# Patient Record
Sex: Male | Born: 1990 | Race: White | Hispanic: No | Marital: Married | Smoking: Former smoker
Health system: Southern US, Community
[De-identification: ages and names within clinical notes are randomized; demographics above are authoritative.]

## PROBLEM LIST (undated history)

## (undated) DIAGNOSIS — K219 Gastro-esophageal reflux disease without esophagitis: Secondary | ICD-10-CM

## (undated) DIAGNOSIS — J309 Allergic rhinitis, unspecified: Secondary | ICD-10-CM

## (undated) DIAGNOSIS — Z87891 Personal history of nicotine dependence: Secondary | ICD-10-CM

## (undated) DIAGNOSIS — J45909 Unspecified asthma, uncomplicated: Secondary | ICD-10-CM

## (undated) DIAGNOSIS — F909 Attention-deficit hyperactivity disorder, unspecified type: Secondary | ICD-10-CM

## (undated) HISTORY — DX: Unspecified asthma, uncomplicated: J45.909

## (undated) HISTORY — DX: Attention-deficit hyperactivity disorder, unspecified type: F90.9

## (undated) HISTORY — DX: Gastro-esophageal reflux disease without esophagitis: K21.9

## (undated) HISTORY — DX: Personal history of nicotine dependence: Z87.891

## (undated) HISTORY — DX: Allergic rhinitis, unspecified: J30.9

---
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
## 2010-09-16 ENCOUNTER — Emergency Department (HOSPITAL_COMMUNITY)
Admission: EM | Admit: 2010-09-16 | Discharge: 2010-09-16 | Disposition: A | Discharge status: Home / Self Care | Payer: Self-pay | Attending: Emergency Medicine | Admitting: Emergency Medicine

## 2010-09-16 ENCOUNTER — Emergency Department (HOSPITAL_COMMUNITY): Payer: Self-pay

## 2010-09-16 DIAGNOSIS — J45909 Unspecified asthma, uncomplicated: Secondary | ICD-10-CM | POA: Insufficient documentation

## 2010-09-16 DIAGNOSIS — S0100XA Unspecified open wound of scalp, initial encounter: Secondary | ICD-10-CM | POA: Insufficient documentation

## 2010-09-16 DIAGNOSIS — Y9229 Other specified public building as the place of occurrence of the external cause: Secondary | ICD-10-CM | POA: Insufficient documentation

## 2010-09-16 DIAGNOSIS — R6884 Jaw pain: Secondary | ICD-10-CM | POA: Insufficient documentation

## 2010-09-16 DIAGNOSIS — S01501A Unspecified open wound of lip, initial encounter: Secondary | ICD-10-CM | POA: Insufficient documentation

## 2010-09-16 DIAGNOSIS — F411 Generalized anxiety disorder: Secondary | ICD-10-CM | POA: Insufficient documentation

## 2011-05-03 ENCOUNTER — Ambulatory Visit (INDEPENDENT_AMBULATORY_CARE_PROVIDER_SITE_OTHER): Payer: 59 | Admitting: Family Medicine

## 2011-05-03 VITALS — BP 136/75 | HR 80 | Temp 98.2°F | Resp 16 | Ht 72.0 in | Wt 207.6 lb

## 2011-05-03 DIAGNOSIS — F909 Attention-deficit hyperactivity disorder, unspecified type: Secondary | ICD-10-CM

## 2011-05-03 DIAGNOSIS — F988 Other specified behavioral and emotional disorders with onset usually occurring in childhood and adolescence: Secondary | ICD-10-CM

## 2011-05-03 DIAGNOSIS — J45902 Unspecified asthma with status asthmaticus: Secondary | ICD-10-CM

## 2011-05-03 MED ORDER — MOMETASONE FURO-FORMOTEROL FUM 100-5 MCG/ACT IN AERO: 2.0000 | INHALATION_SPRAY | Freq: Two times a day (BID) | RESPIRATORY_TRACT | Status: DC

## 2011-05-03 MED ORDER — AMPHETAMINE-DEXTROAMPHET ER 30 MG PO CP24: 30.0000 mg | ORAL_CAPSULE | ORAL | Status: DC

## 2011-05-03 MED ORDER — ALBUTEROL SULFATE HFA 108 (90 BASE) MCG/ACT IN AERS: 2.0000 | INHALATION_SPRAY | Freq: Four times a day (QID) | RESPIRATORY_TRACT | Status: DC | PRN

## 2011-05-03 NOTE — Progress Notes (Signed)
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  Needs refill on medications.  Was tested as child both here and [employer] where he attended school.    Has given permission to his PCP in [place], [place]  On Adderall since elementary school  Also needs albuterol refilled  F/H:  Grandfather had triple bypass, parents are alive and well Cousin is on adderall.  Older brother never took the medicine  O:  Alert, NAD HEENT:  Grossly normal Chest:  Clear Heart:  Reg, no murmur or gallop Abdomen:  Soft, no HSM, no masses or tenderness Skin:  No rashes.  I called Dr. Barbra Sarks who has been prescribing Adderall XR 30 daily, last filled March 11th.  She has verified his ADD status  A:  Ok to refill adderall and asthma meds  P:  Refills approved

## 2011-08-23 ENCOUNTER — Encounter: Payer: Self-pay | Admitting: Family Medicine

## 2012-01-01 ENCOUNTER — Telehealth: Payer: Self-pay | Admitting: Family Medicine

## 2012-01-01 ENCOUNTER — Encounter: Payer: Self-pay | Admitting: Family Medicine

## 2012-01-01 ENCOUNTER — Ambulatory Visit (INDEPENDENT_AMBULATORY_CARE_PROVIDER_SITE_OTHER): Payer: BC Managed Care – PPO | Admitting: Family Medicine

## 2012-01-01 VITALS — BP 131/85 | HR 60 | Temp 98.5°F | Ht 72.0 in | Wt 217.0 lb

## 2012-01-01 DIAGNOSIS — J45909 Unspecified asthma, uncomplicated: Secondary | ICD-10-CM

## 2012-01-01 DIAGNOSIS — K219 Gastro-esophageal reflux disease without esophagitis: Secondary | ICD-10-CM

## 2012-01-01 DIAGNOSIS — J45998 Other asthma: Secondary | ICD-10-CM

## 2012-01-01 DIAGNOSIS — F909 Attention-deficit hyperactivity disorder, unspecified type: Secondary | ICD-10-CM

## 2012-01-01 DIAGNOSIS — F17200 Nicotine dependence, unspecified, uncomplicated: Secondary | ICD-10-CM

## 2012-01-01 MED ORDER — VARENICLINE TARTRATE 1 MG PO TABS: 1.0000 mg | ORAL_TABLET | Freq: Two times a day (BID) | ORAL | Status: DC

## 2012-01-01 MED ORDER — AMPHETAMINE-DEXTROAMPHETAMINE 15 MG PO TABS: 15.0000 mg | ORAL_TABLET | Freq: Every day | ORAL | Status: DC

## 2012-01-01 MED ORDER — AMPHETAMINE-DEXTROAMPHET ER 30 MG PO CP24: 30.0000 mg | ORAL_CAPSULE | ORAL | Status: DC

## 2012-01-01 MED ORDER — BUDESONIDE-FORMOTEROL FUMARATE 160-4.5 MCG/ACT IN AERO: 2.0000 | INHALATION_SPRAY | Freq: Two times a day (BID) | RESPIRATORY_TRACT | Status: DC

## 2012-01-01 MED ORDER — MOMETASONE FURO-FORMOTEROL FUM 100-5 MCG/ACT IN AERO: 2.0000 | INHALATION_SPRAY | Freq: Two times a day (BID) | RESPIRATORY_TRACT | Status: DC

## 2012-01-01 MED ORDER — ESOMEPRAZOLE MAGNESIUM 40 MG PO CPDR: 40.0000 mg | DELAYED_RELEASE_CAPSULE | Freq: Every day | ORAL | Status: DC

## 2012-01-01 NOTE — Progress Notes (Signed)
Office Note 01/05/2012  CC:  Chief Complaint  Patient presents with  . Establish Care    Rash; ADD; GERD    HPI:  Mark Gardner is a 21 y.o. White male who is here to establish care. Patient's most recent primary MD: [facility]. Old records in EPIC/HL were reviewed prior to or during today's visit.  ADHD: has carried this dx since grade school, has been out of meds for several months.  Finds adderall XR 30mg  qAM about75% helpful and it usually lasts 7 hrs or so--admittedly not long enough on most days.  We discussed some options and he voiced desire for an additional dose of short acting adderall for most early afternoons.  Minimal app supp s/e but no wt loss, no other probs on this med.  Asthma: Says he has been requiring albuterol for rescue "too much".  Says he has been compliant with dulera 100/50 and was once on singulair but never got it RFd after running out b/c he doesn't like to take pills (too hard to remember).    He is currently in the process of quitting smoking: has finished the chantix trial week.  Denies SE from med.   Past Medical History  Diagnosis Date  . Asthma   . ADHD (attention deficit hyperactivity disorder)     Dx'd in gradeschool  . Tobacco dependence   . Allergic rhinitis     dogs and cats (he has a dog and a cat).  . GERD (gastroesophageal reflux disease)     Past Surgical History  Procedure Date  . Tonsillectomy and adenoidectomy 2008    Family History  Problem Relation Age of Onset  . Hyperlipidemia Mother   . Hyperlipidemia Father   . Heart disease Father     History   Social History  . Marital Status: Single    Spouse Name: N/A    Number of Children: N/A  . Years of Education: N/A   Occupational History  . Not on file.   Social History Main Topics  . Smoking status: Current Every Day Smoker    Types: Cigarettes  . Smokeless tobacco: Never Used     Comment: currently on Chantix 12/2011  . Alcohol Use: Yes   . Drug Use: No  . Sexually Active: Not on file   Other Topics Concern  . 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  Works out several days a week.Two brothers, one sister--all older.  Parents live in [place].    Outpatient Encounter Prescriptions as of 01/01/2012  Medication Sig Dispense Refill  . albuterol (PROVENTIL HFA;VENTOLIN HFA) 108 (90 BASE) MCG/ACT inhaler Inhale 2 puffs into the lungs every 6 (six) hours as needed.  1 Inhaler  11  . amphetamine-dextroamphetamine (ADDERALL XR) 30 MG 24 hr capsule Take 1 capsule (30 mg total) by mouth every morning.  30 capsule  0  . [DISCONTINUED] amphetamine-dextroamphetamine (ADDERALL XR) 30 MG 24 hr capsule Take 1 capsule (30 mg total) by mouth every morning.  30 capsule  0  . [DISCONTINUED] mometasone-formoterol (DULERA) 100-5 MCG/ACT AERO Inhale 2 puffs into the lungs 2 (two) times daily.  1 Inhaler  11  . [DISCONTINUED] mometasone-formoterol (DULERA) 100-5 MCG/ACT AERO Inhale 2 puffs into the lungs 2 (two) times daily.  1 Inhaler  11  . amphetamine-dextroamphetamine (ADDERALL) 15 MG tablet Take 1 tablet (15 mg  total) by mouth daily.  30 tablet  0  . esomeprazole (NEXIUM) 40 MG capsule Take 1 capsule (40 mg total) by mouth daily before breakfast.  90 capsule  2  . varenicline (CHANTIX CONTINUING MONTH PAK) 1 MG tablet Take 1 tablet (1 mg total) by mouth 2 (two) times daily.  60 tablet  1  . [DISCONTINUED] budesonide-formoterol (SYMBICORT) 160-4.5 MCG/ACT inhaler Inhale 2 puffs into the lungs 2 (two) times daily.  1 Inhaler  6  . [DISCONTINUED] budesonide-formoterol (SYMBICORT) 160-4.5 MCG/ACT inhaler Inhale 2 puffs into the lungs 2 (two) times daily.  1 Inhaler  6  . [DISCONTINUED] esomeprazole (NEXIUM) 40 MG capsule Take 1 capsule (40 mg total) by mouth daily before breakfast.  90  capsule  2  . [DISCONTINUED] varenicline (CHANTIX CONTINUING MONTH PAK) 1 MG tablet Take 1 tablet (1 mg total) by mouth 2 (two) times daily.  60 tablet  1    Allergies  Allergen Reactions  . Sulfa Antibiotics     ROS Review of Systems  Constitutional: Negative for fever and fatigue.  HENT: Negative for congestion and sore throat.   Eyes: Negative for visual disturbance.  Respiratory: Negative for cough.   Cardiovascular: Negative for chest pain.  Gastrointestinal: Negative for nausea and abdominal pain.  Genitourinary: Negative for dysuria.  Musculoskeletal: Negative for back pain and joint swelling.  Skin: Positive for rash (scattered eczematous patches on arms and trunk).  Neurological: Negative for weakness and headaches.  Hematological: Negative for adenopathy.  Psychiatric/Behavioral: Negative for dysphoric mood. The patient is not nervous/anxious.     PE; Blood pressure 131/85, pulse 60, temperature 98.5 F (36.9 C), temperature source Temporal, height 6' (1.829 m), weight 217 lb (98.431 kg), SpO2 98.00%. Gen: Alert, well appearing.  Patient is oriented to person, place, time, and situation. ENT: Ears: EACs clear, normal epithelium.  TMs with good light reflex and landmarks bilaterally.  Eyes: no injection, icteris, swelling, or exudate.  EOMI, PERRLA. Nose: no drainage or turbinate edema/swelling.  No injection or focal lesion.  Mouth: lips without lesion/swelling.  Oral mucosa pink and moist.  Dentition intact and without obvious caries or gingival swelling.  Oropharynx without erythema, exudate, or swelling.  Neck - No masses or thyromegaly or limitation in range of motion CV: RRR, no m/r/g.   LUNGS: CTA bilat, nonlabored resps, good aeration in all lung fields. ABD: soft, NT, ND, BS normal.  No hepatospenomegaly or mass.  No bruits. EXT: no clubbing, cyanosis, or edema.  SKIN: scattered small patches of pinkish papular rash with excoriations c/w eczematous  dermatitis  Pertinent labs:  None today  ASSESSMENT AND PLAN:   New pt: obtain old records from [employer].  ADHD (attention deficit hyperactivity disorder) Trial of adderall short acting 15mg , 1 q afternoon prn. Continue adderall xR 30mg  qAM.  Therapeutic expectations and side effect profile of medication discussed today.  Patient's questions answered. Discussed trial of vyvanse in place of this entire regiment but pt cited that there will likely be days in which he does not need an extended duration of action such as vyvanse would consistently provide. 30 day supply of both adderalls given today, no RF.   Recheck in office in 1 mo.  Asthma, persistent not controlled Increase Dulera to 200/50, 2 puffs bid.  Continue q4-6h prn albuterol HFA for rescue.   May eventually need to add singulair back on even though he doesn't like taking pills. No sign of any need for systemic steroid burst at this  time.  Tobacco dependence Currently in middle of cessation attempt.  He is finished with the start-up phase of chantix and I gave rx to finish a 12 wk trial of the full dose of chantix.  Discouraged use of nicotine replacement with chantix.  GERD (gastroesophageal reflux disease) Nexium rx'd today.  An After Visit Summary was printed and given to the patient.  35 min was spent with pt today, with >50% of this time spent in counseling and care coordination for the above problems.  Return in about 4 weeks (around 01/29/2012) for f/u aDHD.

## 2012-01-01 NOTE — Telephone Encounter (Signed)
Patient needs his Rx's to be sent to [facility]. Patient said Dr. Milinda Cave said he was going to change the [facility] to 200-5, please change. Patient wants to know if the adderrall can be sent to [facility]. Please call him.

## 2012-01-02 NOTE — Telephone Encounter (Signed)
I messed up on the dulera, so I just now did a new one and I need you to figure out how to [facility]--I'll mess it up. Otherwise, I printed out all of his rx's (including adderall) yesterday and handed them to him AT HIS REQUEST so he could [facility] them to [facility]. He needs to send in the ones I gave him!--thx

## 2012-01-02 NOTE — Telephone Encounter (Signed)
I have attempted to contact this patient by phone with the following results: left message to return my call on answering machine.

## 2012-01-02 NOTE — Telephone Encounter (Signed)
Dr. Milinda Cave, please advise about Dulera dose.  OV note not complete.  I will him know about dulera and adderall when we call him back.

## 2012-01-05 ENCOUNTER — Encounter: Payer: Self-pay | Admitting: Family Medicine

## 2012-01-05 DIAGNOSIS — J4540 Moderate persistent asthma, uncomplicated: Secondary | ICD-10-CM | POA: Insufficient documentation

## 2012-01-05 DIAGNOSIS — F17200 Nicotine dependence, unspecified, uncomplicated: Secondary | ICD-10-CM | POA: Insufficient documentation

## 2012-01-05 DIAGNOSIS — F909 Attention-deficit hyperactivity disorder, unspecified type: Secondary | ICD-10-CM | POA: Insufficient documentation

## 2012-01-05 DIAGNOSIS — K219 Gastro-esophageal reflux disease without esophagitis: Secondary | ICD-10-CM | POA: Insufficient documentation

## 2012-01-05 NOTE — Assessment & Plan Note (Signed)
Increase Dulera to 200/50, 2 puffs bid.  Continue q4-6h prn albuterol HFA for rescue.   May eventually need to add singulair back on even though he doesn't like taking pills. No sign of any need for systemic steroid burst at this time.

## 2012-01-05 NOTE — Assessment & Plan Note (Signed)
Trial of adderall short acting 15mg , 1 q afternoon prn. Continue adderall xR 30mg  qAM.  Therapeutic expectations and side effect profile of medication discussed today.  Patient's questions answered. Discussed trial of vyvanse in place of this entire regiment but pt cited that there will likely be days in which he does not need an extended duration of action such as vyvanse would consistently provide. 30 day supply of both adderalls given today, no RF.   Recheck in office in 1 mo.

## 2012-01-05 NOTE — Assessment & Plan Note (Signed)
Nexium rx'd today.

## 2012-01-05 NOTE — Assessment & Plan Note (Signed)
Currently in middle of cessation attempt.  He is finished with the start-up phase of chantix and I gave rx to finish a 12 wk trial of the full dose of chantix.  Discouraged use of nicotine replacement with chantix.

## 2012-01-08 MED ORDER — MOMETASONE FURO-FORMOTEROL FUM 200-5 MCG/ACT IN AERO: INHALATION_SPRAY | RESPIRATORY_TRACT | Status: DC

## 2012-01-08 NOTE — Telephone Encounter (Signed)
Pt notified he can mail adderall, but he would have to mail original, it could not be faxed.  Advised we would send [facility].  Pt requests chantix to be sent.  Advised he was given written RX for that and he would need to mail to pharmacy.  He is agreeable.  He will take adderall to local pharmacy.

## 2012-01-31 ENCOUNTER — Ambulatory Visit: Payer: BC Managed Care – PPO | Admitting: Family Medicine

## 2012-02-27 ENCOUNTER — Telehealth: Payer: Self-pay | Admitting: Family Medicine

## 2012-02-27 MED ORDER — AMPHETAMINE-DEXTROAMPHET ER 30 MG PO CP24: 30.0000 mg | ORAL_CAPSULE | ORAL | Status: DC

## 2012-02-27 MED ORDER — AMPHETAMINE-DEXTROAMPHETAMINE 15 MG PO TABS: 15.0000 mg | ORAL_TABLET | Freq: Every day | ORAL | Status: DC

## 2012-02-27 NOTE — Telephone Encounter (Signed)
I spoke to the patient and he is in town.  He does not remember receiving written 90 day scripts for nexium and asthma meds in December.  Pt has called [facility] and they do not have Dulera RX that was called in on 12/13 and pt states they told him we can refill Adderall with them. Pt did not have adderall scripts filled and does not have them now.  He did not keep January appt due to not taking meds. Advised pt he needs follow up.  Advised we do not give 90 day scripts for Adderall and [facility] has to have hard copy of RX as it is a controlled substance.  Advised pt we do not replace lost scripts...once it leaves our office it is the patient's responsibility.  Advised he must be established, on the same dose and keep scheduled follow ups before we will prescribe more than a 30 day supply. No meds have been called to [facility] by me.  Please advise.  Pt aware he will receive call back tomorrow.

## 2012-02-27 NOTE — Telephone Encounter (Signed)
Verified with Diane that pt needs 90 day RX's of all meds, including Adderall. Pt last seen on 01/01/12 and was advised to have 1 month follow up after trial of new dose.  Pt has not been seen since 12/2011. I will send refills to mail order.  Does pt need follow up prior to any Adderall refills?  Pt is student at [employer], but I am not sure if patient is there now.

## 2012-02-27 NOTE — Telephone Encounter (Signed)
Patient is out of all of his meds, please send Rx to [facility] [phone]

## 2012-02-27 NOTE — Telephone Encounter (Signed)
May print all rx's 90 day supply with no RF except adderall.  I'll do 30 day supply of adderall only, must see for f/u after he has taken this for a month.  Please warn pt that lost rx's as an isolated event is understandable, but if it happens again then it is considered a pattern, and my policy is to rx no further controlled substances if lost rx's happens twice. Emphasize to him that once rx's are handed to him they are his responsibility and he should either bring them to the pharmacy to fill or hold or he should mail to mail-order pharmacy right away.  -thx

## 2012-02-27 NOTE — Telephone Encounter (Signed)
Pls notify pt that I do not do 90 day supply rx's for controlled substances like adderall. Pls see if he is in town or is he at college right now. If in town, would prefer to see him for f/u for ADHD med check and asthma follow up. If he's at college, I am willing to do a 30d supply of his adderall, then he needs to get f/u with his local MD there to get ongoing monitoring and rx's while he's at school. Let me know-thx

## 2012-02-28 NOTE — Telephone Encounter (Signed)
Pt notified of below.  He is agreeable and voices understanding.  Pt is requesting 90 day supply of Chantix also.  Pt started Chantix on 11/18 (verified with pharmacy as we were not prescribing physician).  Pt has been off for about 2 weeks.  Does he need to start over?  Is it OK to send continuing RX to mail order?

## 2012-02-28 NOTE — Telephone Encounter (Signed)
I have attempted to contact this patient by phone with the following results: left message to return my call on answering machine (mobile).  

## 2012-02-28 NOTE — Telephone Encounter (Signed)
I deferred this one to you til Monday after discussing with Judeth Cornfield

## 2012-02-29 NOTE — Telephone Encounter (Signed)
Chantix course is 12 weeks total.  Ask pt how many weeks of this med he has completed and I'll decide if I will rx more.  I am pretty sure he's not going to need/get a 90 day supply, though.  Also, ask if he has successfully quit smoking at this point.-thx

## 2012-03-04 MED ORDER — ESOMEPRAZOLE MAGNESIUM 40 MG PO CPDR: 40.0000 mg | DELAYED_RELEASE_CAPSULE | Freq: Every day | ORAL | Status: DC

## 2012-03-04 MED ORDER — MOMETASONE FURO-FORMOTEROL FUM 200-5 MCG/ACT IN AERO: INHALATION_SPRAY | RESPIRATORY_TRACT | Status: DC

## 2012-03-04 MED ORDER — ALBUTEROL SULFATE HFA 108 (90 BASE) MCG/ACT IN AERS: 2.0000 | INHALATION_SPRAY | Freq: Four times a day (QID) | RESPIRATORY_TRACT | Status: DC | PRN

## 2012-03-04 NOTE — Telephone Encounter (Signed)
90 day RX sent. 

## 2012-03-15 ENCOUNTER — Ambulatory Visit: Payer: 59 | Admitting: Family Medicine

## 2012-03-15 VITALS — BP 126/74 | HR 108 | Temp 98.7°F | Resp 17 | Ht 72.0 in | Wt 214.0 lb

## 2012-03-15 DIAGNOSIS — L270 Generalized skin eruption due to drugs and medicaments taken internally: Secondary | ICD-10-CM

## 2012-03-15 DIAGNOSIS — T7800XA Anaphylactic reaction due to unspecified food, initial encounter: Secondary | ICD-10-CM

## 2012-03-15 DIAGNOSIS — L299 Pruritus, unspecified: Secondary | ICD-10-CM

## 2012-03-15 DIAGNOSIS — L509 Urticaria, unspecified: Secondary | ICD-10-CM

## 2012-03-15 DIAGNOSIS — J029 Acute pharyngitis, unspecified: Secondary | ICD-10-CM

## 2012-03-15 MED ORDER — METHYLPREDNISOLONE SODIUM SUCC 125 MG IJ SOLR
125.0000 mg | Freq: Once | INTRAMUSCULAR | Status: AC
  Administered 2012-03-15: 125 mg via INTRAVENOUS

## 2012-03-15 MED ORDER — DIPHENHYDRAMINE HCL 25 MG PO CAPS
50.0000 mg | ORAL_CAPSULE | Freq: Four times a day (QID) | ORAL | Status: AC | PRN
  Administered 2012-03-15: 50 mg via ORAL

## 2012-03-15 MED ORDER — DIPHENHYDRAMINE HCL 25 MG PO CAPS
50.0000 mg | ORAL_CAPSULE | Freq: Once | ORAL | Status: AC
  Administered 2012-03-15: 50 mg via ORAL

## 2012-03-15 MED ORDER — PREDNISONE 10 MG PO TABS: ORAL_TABLET | ORAL | Status: DC

## 2012-03-15 MED ORDER — EPINEPHRINE 0.3 MG/0.3ML IJ DEVI: 0.3000 mg | Freq: Once | INTRAMUSCULAR | Status: AC

## 2012-03-15 MED ORDER — EPINEPHRINE 0.3 MG/0.3ML IJ DEVI
0.4000 mg | Freq: Once | INTRAMUSCULAR | Status: AC
  Administered 2012-03-15: 0.4 mg via INTRAMUSCULAR

## 2012-03-15 MED ORDER — METHYLPREDNISOLONE ACETATE 80 MG/ML IJ SUSP
120.0000 mg | Freq: Once | INTRAMUSCULAR | Status: AC
  Administered 2012-03-15: 120 mg via INTRAMUSCULAR

## 2012-03-15 NOTE — Patient Instructions (Addendum)
For the next 24h dose Benadryl 50mg  (2 pills) every 4-6 hours and then stop.  If rash/hives return please restart. For the next 5 days use Zyrtec 10mg  daily and Zantac 300mg  daily.  If rash/hives return please restart. Take Prednisone as prescribed.   Due to how your rash started and looked like something you ingested is the most likely cause.  If you develop any tongue or throat swelling or shortness or breath please use Epi pen and then seek medical care.   Hives Hives are itchy, red, swollen areas of the skin. They can vary in size and location on your body. Hives can come and go for hours or several days (acute hives) or for several weeks (chronic hives). Hives do not spread from person to person (noncontagious). They may get worse with scratching, exercise, and emotional stress. CAUSES   Allergic reaction to food, additives, or drugs.  Infections, including the common cold.  Illness, such as vasculitis, lupus, or thyroid disease.  Exposure to sunlight, heat, or cold.  Exercise.  Stress.  Contact with chemicals. SYMPTOMS   Red or white swollen patches on the skin. The patches may change size, shape, and location quickly and repeatedly.  Itching.  Swelling of the hands, feet, and face. This may occur if hives develop deeper in the skin. DIAGNOSIS  Your caregiver can usually tell what is wrong by performing a physical exam. Skin or blood tests may also be done to determine the cause of your hives. In some cases, the cause cannot be determined. TREATMENT  Mild cases usually get better with medicines such as antihistamines. Severe cases may require an emergency epinephrine injection. If the cause of your hives is known, treatment includes avoiding that trigger.  HOME CARE INSTRUCTIONS   Avoid causes that trigger your hives.  Take antihistamines as directed by your caregiver to reduce the severity of your hives. Non-sedating or low-sedating antihistamines are usually  recommended. Do not drive while taking an antihistamine.  Take any other medicines prescribed for itching as directed by your caregiver.  Wear loose-fitting clothing.  Keep all follow-up appointments as directed by your caregiver. SEEK MEDICAL CARE IF:   You have persistent or severe itching that is not relieved with medicine.  You have painful or swollen joints. SEEK IMMEDIATE MEDICAL CARE IF:   You have a fever.  Your tongue or lips are swollen.  You have trouble breathing or swallowing.  You feel tightness in the throat or chest.  You have abdominal pain. These problems may be the first sign of a life-threatening allergic reaction. Call your local emergency services (911 in [place] MAKE SURE YOU:   Understand these instructions.  Will watch your condition.  Will get help right away if you are not doing well or get worse. Document Released: 01/07/2005 Document Revised: 07/09/2011 Document Reviewed: 04/02/2011 [facility] Patient Information 2013 [place], [place]. Food Allergy A food allergy occurs from eating something you are sensitive to. Food allergies occur in all age groups. It may be passed to you from your parents (heredity).  CAUSES  Some common causes are cow's milk, seafood, eggs, nuts (including peanut butter), wheat, and soybeans. SYMPTOMS  Common problems are:   Swelling around the mouth.  An itchy, red rash.  Hives.  Vomiting.  Diarrhea. Severe allergic reactions are life-threatening. This reaction is called anaphylaxis. It can cause the mouth and throat to swell. This makes it hard to breathe and swallow. In severe reactions, only a small amount of food may  be fatal within seconds. HOME CARE INSTRUCTIONS   If you are unsure what caused the reaction, keep a diary of foods eaten and symptoms that followed. Avoid foods that cause reactions.  If hives or rash are present:  Take medicines as directed.  Use an over-the-counter antihistamine  (diphenhydramine) to treat hives and itching as needed.  Apply cold compresses to the skin or take baths in cool water. Avoid hot baths or showers. These will increase the redness and itching.  If you are severely allergic:  Hospitalization is often required following a severe reaction.  Wear a medical alert bracelet or necklace that describes the allergy.  Carry your anaphylaxis kit or epinephrine injection with you at all times. Both you and your family members should know how to use this. This can be lifesaving if you have a severe reaction. If epinephrine is used, it is important for you to seek immediate medical care or call your local emergency services (911 in [place] When the epinephrine wears off, it can be followed by a delayed reaction, which can be fatal.  Replace your epinephrine immediately after use in case of another reaction.  Ask your caregiver for instructions if you have not been taught how to use an epinephrine injection.  Do not drive until medicines used to treat the reaction have worn off, unless approved by your caregiver. SEEK MEDICAL CARE IF:   You suspect a food allergy. Symptoms generally happen within 30 minutes of eating a food.  Your symptoms have not gone away within 2 days. See your caregiver sooner if symptoms are getting worse.  You develop new symptoms.  You want to retest yourself with a food or drink you think causes an allergic reaction. Never do this if an anaphylactic reaction to that food or drink has happened before.  There is a return of the symptoms which brought you to your caregiver. SEEK IMMEDIATE MEDICAL CARE IF:   You have trouble breathing, are wheezing, or you have a tight feeling in your chest or throat.  You have a swollen mouth, or you have hives, swelling, or itching all over your body. Use your epinephrine injection immediately. This is given into the outside of your thigh, deep into the muscle. Following use of the epinephrine  injection, seek help right away. Seek immediate medical care or call your local emergency services (911 in [place] MAKE SURE YOU:   Understand these instructions.  Will watch your condition.  Will get help right away if you are not doing well or get worse. Document Released: 01/05/2000 Document Revised: 04/01/2011 Document Reviewed: 08/27/2007 [facility] Patient Information 2013 [place], [place].

## 2012-03-15 NOTE — Progress Notes (Signed)
I was involved with care and exam, and patient discussed with Benny Lennert, PA-C during 2nd OV. Agree with assessment and plan of care per her note.

## 2012-03-15 NOTE — Progress Notes (Signed)
   [address] [place] [address]   Phone [phone]  Subjective:    Patient ID: Mark Gardner, male    DOB: 21-Jul-1990, 22 y.o.   MRN: [id number]  HPI  Pt presents to clinic for a 2nd visit today.  He went home and started moving around and the rash got worse so he came back to the clinic because he was concerned.  His lips feel fuller/harder than normal and are itchy.  He has no tongue or throat swelling or fullness and no SOB or difficulty breathing.  He is very itchy.  Review of Systems  HENT: Negative for facial swelling, drooling and trouble swallowing.   Respiratory: Negative for cough, shortness of breath and wheezing.   Skin: Positive for rash.       Objective:   Physical Exam  Vitals reviewed. Constitutional: He appears well-developed and well-nourished.  HENT:  Head: Normocephalic and atraumatic.  Right Ear: External ear normal.  Left Ear: External ear normal.  Mouth/Throat: Uvula is midline, oropharynx is clear and moist and mucous membranes are normal. No oral lesions. No edematous.  Eyes: Conjunctivae are normal.  Cardiovascular: Normal rate, regular rhythm and normal heart sounds.   No murmur heard. Pulmonary/Chest: Effort normal and breath sounds normal. He has no wheezes.  Skin: Rash noted. Rash is maculopapular (on upper chest) and urticarial (upper arms, legs, abd and back).   Zantac 300mg  po given at 14:44 Zyrtec 10mg  po given at 14:44  IV started and Solumedrol given -- after about 15 mins the hives erythema had significantly resolved but the maculopapular rash was not better.  At 16:30 his maculopapular rash is improved and the hives are almost resolved.  At 16:45 he started to feel a little dizzy but no SOB.  Pt seems very nervous about the entire situation.  I discussed with him what they would do in the ED and he chose to wait and watch here for a little longer. At 17:30 pt felt much better - both rashes are significantly improved and pt feels well  enough to go home with instructions that we discussed.       Assessment & Plan:  Hives 2nd to probable ingestion of unknown substance to patient and then a significant histamine release causing significant urticarial rash.  We saw patient for 2 visits in the same day and over the course of the day monitored him for 5 hours.  When he left the 2nd time his rash looked the best that it had looked from the entire time we were working with him.  Dr Neva Seat was involved in the 2nd part of his visit.

## 2012-03-15 NOTE — Progress Notes (Signed)
  Subjective:    Patient ID: Mark Gardner, male    DOB: 1990-08-03, 22 y.o.   MRN: [id number]  HPI Has itchy rash all over for last 20 hours. No resp sxs. No swelling of breathing passage. No new meds and presently only taking inhalers No st Hives are worsening as we speak and chest is tightening. Lungs still clear  Review of Systems     Objective:   Physical Exam  Constitutional: He is oriented to person, place, and time. He appears well-developed and well-nourished. He appears distressed.  HENT:  Mouth/Throat: Uvula is midline. No oral lesions. No edematous. Posterior oropharyngeal erythema present. No oropharyngeal exudate or posterior oropharyngeal edema.  Cardiovascular: Normal rate, regular rhythm and normal heart sounds.   Pulmonary/Chest: Effort normal and breath sounds normal.  Neurological: He is alert and oriented to person, place, and time. He exhibits normal muscle tone. Coordination normal.  Skin: Skin is warm, dry and intact. Rash noted.  Covered erythematous plaques very itchy On scalp, palms and soles  anxious/worse hives   Results for orders placed in visit on 03/15/12  POCT SKIN KOH      Result Value Range   Skin KOH, POC Negative    GLUCOSE, POCT (MANUAL RESULT ENTRY)      Result Value Range   POC Glucose 87  70 - 99 mg/dl        Assessment & Plan:  Hives cause unclear Depomedrol/prednisone/zyrtec and benedryl Epinephrine .4 im stat/observe for improvement Refer to allergy/[place] Epipens

## 2012-05-07 ENCOUNTER — Encounter: Payer: BC Managed Care – PPO | Admitting: Family Medicine

## 2012-05-07 ENCOUNTER — Ambulatory Visit (INDEPENDENT_AMBULATORY_CARE_PROVIDER_SITE_OTHER): Payer: BC Managed Care – PPO | Admitting: Nurse Practitioner

## 2012-05-07 ENCOUNTER — Encounter: Payer: Self-pay | Admitting: Nurse Practitioner

## 2012-05-07 VITALS — BP 120/84 | HR 80 | Temp 98.3°F | Resp 16 | Ht 72.0 in | Wt 210.0 lb

## 2012-05-07 DIAGNOSIS — Z0000 Encounter for general adult medical examination without abnormal findings: Secondary | ICD-10-CM

## 2012-05-07 DIAGNOSIS — J45998 Other asthma: Secondary | ICD-10-CM

## 2012-05-07 DIAGNOSIS — J45909 Unspecified asthma, uncomplicated: Secondary | ICD-10-CM

## 2012-05-07 DIAGNOSIS — F909 Attention-deficit hyperactivity disorder, unspecified type: Secondary | ICD-10-CM

## 2012-05-07 NOTE — Patient Instructions (Addendum)
Recommend filling script for singulair for better asthma mangement for moderate persistent asthma, continue immunotherapy with allergist. Also, begin Neilmed sinus washes daily (bottle with black top). Rinse eyes after coming in from outside. Return in 1 month as needed.

## 2012-05-07 NOTE — Assessment & Plan Note (Signed)
Return in 1 month to discuss continuation/ discontinuation of med.

## 2012-05-07 NOTE — Progress Notes (Signed)
Subjective:     Patient ID: Mark Gardner, male   DOB: December 12, 1990, 22 y.o.   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 He brought physical form. He has no new medical complaints. He has seen an allergist since his last visit and has begun immunotherapy and plans to start singilair. Chart has been reviewed.   Review of Systems  Constitutional: Negative.   HENT: Negative.   Eyes: Negative.   Respiratory: Positive for chest tightness.        Occasional chest tightness when asthma flares, uses rescue inhaler approx twice/ weekly. No night time coughing. No wheezing or SOB.  Cardiovascular: Negative.   Gastrointestinal: Negative.   Endocrine: Negative.   Genitourinary: Negative.   Musculoskeletal: Negative.   Skin: Positive for rash.       Episode of hives approx 4-5 months ago, seen in urgent care.  Allergic/Immunologic: Positive for environmental allergies.       Episode requiring urgent care few months ago. Developed hives, treated with benadryl. Has new script for epi-pen.  Neurological: Negative.        Historically treated for ADHD, no symptoms at present, no meds taken in last couple months.  Hematological: Negative.   Psychiatric/Behavioral: Negative.        Objective:   Physical Exam  Constitutional: He is oriented to person, place, and time. He appears well-developed and well-nourished. No distress.  HENT:  Head: Normocephalic and atraumatic.  Right Ear: Hearing, external ear and ear canal normal. Tympanic membrane is retracted.  Left Ear: Hearing, tympanic membrane, external ear and ear canal normal.  Nose: Nose normal.  Mouth/Throat: Mucous membranes are normal. Posterior oropharyngeal erythema present.  Eyes: Conjunctivae and EOM are normal. Pupils are equal, round, and reactive to light.  Neck: Normal range of motion. Neck supple.  Cardiovascular: Normal rate, regular rhythm and normal heart  sounds.   No murmur heard. Pulmonary/Chest: Effort normal and breath sounds normal. No respiratory distress. He has no wheezes.  Abdominal: Soft. Bowel sounds are normal. He exhibits no distension and no mass. There is no tenderness. There is no guarding.  Genitourinary: Penis normal.  No inguinal hernia palpated, no c/o pain or testicular size discrepancy.  Musculoskeletal: Normal range of motion.  Lymphadenopathy:    He has no cervical adenopathy.  Neurological: He is alert and oriented to person, place, and time. No cranial nerve deficit.  Skin: Skin is warm and dry.  Psychiatric: He has a normal mood and affect. His behavior is normal. Thought content normal.       Assessment:    1) Physical Exam all systems normal findings except effusion behind R TM, erythematous oropharynx consistent with allergy sensitivity this time of year. 2)Moderate persistent asthma, controlled 3) ADHD. No reported symptoms at present, pt reports not currently taking meds.    Plan:    1) Recommend CPE q 2 years. 2) Allergist treating asthma, encourage to continue with immunotherapy, fill new singulair, daily sinus washes to decrease hypersensitivty reactions to environmental allergies. 3) return in 1 month to discuss continuation/discontinuation of treatment.

## 2012-05-07 NOTE — Assessment & Plan Note (Signed)
Pt will continue to see allergist for asthma mangement. Encouraged pt to begin sinus washes daily and continue with immunotherapy.

## 2012-08-06 ENCOUNTER — Other Ambulatory Visit: Payer: Self-pay | Admitting: Family Medicine

## 2012-08-07 NOTE — Telephone Encounter (Signed)
Needs OV, or needs to get from PCP - looks like last CPE with Corinda Gubler

## 2012-10-20 ENCOUNTER — Ambulatory Visit (INDEPENDENT_AMBULATORY_CARE_PROVIDER_SITE_OTHER): Payer: 59 | Admitting: Family Medicine

## 2012-10-20 ENCOUNTER — Encounter: Payer: Self-pay | Admitting: Family Medicine

## 2012-10-20 VITALS — BP 132/83 | HR 88 | Temp 98.8°F | Resp 18 | Ht 72.0 in | Wt 224.0 lb

## 2012-10-20 DIAGNOSIS — J45909 Unspecified asthma, uncomplicated: Secondary | ICD-10-CM

## 2012-10-20 DIAGNOSIS — F909 Attention-deficit hyperactivity disorder, unspecified type: Secondary | ICD-10-CM

## 2012-10-20 DIAGNOSIS — Z23 Encounter for immunization: Secondary | ICD-10-CM

## 2012-10-20 DIAGNOSIS — J4540 Moderate persistent asthma, uncomplicated: Secondary | ICD-10-CM

## 2012-10-20 DIAGNOSIS — K219 Gastro-esophageal reflux disease without esophagitis: Secondary | ICD-10-CM

## 2012-10-20 MED ORDER — AMPHETAMINE-DEXTROAMPHET ER 30 MG PO CP24: 30.0000 mg | ORAL_CAPSULE | ORAL | Status: DC

## 2012-10-20 MED ORDER — MOMETASONE FURO-FORMOTEROL FUM 200-5 MCG/ACT IN AERO: INHALATION_SPRAY | RESPIRATORY_TRACT | Status: DC

## 2012-10-20 MED ORDER — AMPHETAMINE-DEXTROAMPHETAMINE 15 MG PO TABS: 15.0000 mg | ORAL_TABLET | Freq: Every day | ORAL | Status: DC

## 2012-10-20 MED ORDER — ALBUTEROL SULFATE HFA 108 (90 BASE) MCG/ACT IN AERS: 2.0000 | INHALATION_SPRAY | Freq: Four times a day (QID) | RESPIRATORY_TRACT | Status: DC | PRN

## 2012-10-20 MED ORDER — ESOMEPRAZOLE MAGNESIUM 40 MG PO CPDR: 40.0000 mg | DELAYED_RELEASE_CAPSULE | Freq: Every day | ORAL | Status: DC

## 2012-10-20 NOTE — Progress Notes (Signed)
OFFICE NOTE  10/20/2012  CC:  Chief Complaint  Patient presents with  . Medication Refill     HPI: Patient is a 22 y.o. Caucasian male who is here for 5 mo f/u ADHD, asthma.    ADHD: Typical adderall dosing: 30mg  XR qam and 15 rapid/short acting adderal qAM and this was helpful/adequate.   He has made the last rx's for each one of these last 2-3 months b/c he doesn't need to take it every day. Finished college, engaged and set to be married 05/2013.  Asthma: Dulera 200/5 2 puffs bid---as long as he takes this he doesn't require rescue albut at all.  GERD: nexium OTC is adequate for his GERD but if he skips a day he feels signif GERD sx's.  He is about 50% successful in eating a GERD diet.  Pertinent PMH:  Past Medical History  Diagnosis Date  . Asthma   . ADHD (attention deficit hyperactivity disorder)     Dx'd in gradeschool  . Tobacco dependence   . Allergic rhinitis     dogs and cats (he has a dog and a cat).  . GERD (gastroesophageal reflux disease)     MEDS:  Outpatient Prescriptions Prior to Visit  Medication Sig Dispense Refill  . EPINEPHrine (EPIPEN) 0.3 mg/0.3 mL DEVI Inject 0.3 mLs (0.3 mg total) into the muscle once.  2 Device  3  . albuterol (VENTOLIN HFA) 108 (90 BASE) MCG/ACT inhaler Inhale 2 puffs into the lungs every 6 (six) hours as needed for wheezing. NEED VISIT OR NEED TO GET FROM PCP  18 g  0  . amphetamine-dextroamphetamine (ADDERALL XR) 30 MG 24 hr capsule Take 1 capsule (30 mg total) by mouth every morning.  30 capsule  0  . esomeprazole (NEXIUM) 40 MG capsule Take 1 capsule (40 mg total) by mouth daily before breakfast.  90 capsule  0  . mometasone-formoterol (DULERA) 200-5 MCG/ACT AERO 2 puffs twice per day EVERY DAY for asthma maintenance therapy  39 g  0  . amphetamine-dextroamphetamine (ADDERALL) 15 MG tablet Take 1 tablet (15 mg total) by mouth daily. (to be taken in late afternoon in combo with his adderall XR which he takes in the morning)  30  tablet  0  . mometasone-formoterol (DULERA) 100-5 MCG/ACT AERO Inhale 2 puffs into the lungs 2 (two) times daily. NEED VISIT OR NEED TO GET FROM PCP  13 g  0   Facility-Administered Medications Prior to Visit  Medication Dose Route Frequency Provider Last Rate Last Dose  . diphenhydrAMINE (BENADRYL) capsule 50 mg  50 mg Oral Q6H PRN Jonita Albee, MD   50 mg at 03/15/12 1101    PE: Blood pressure 132/83, pulse 88, temperature 98.8 F (37.1 C), temperature source Temporal, resp. rate 18, height 6' (1.829 m), weight 224 lb (101.606 kg), SpO2 98.00%. Gen: Alert, well appearing.  Patient is oriented to person, place, time, and situation. ENT: Ears: EACs clear, normal epithelium.  TMs with good light reflex and landmarks bilaterally.  Eyes: no injection, icteris, swelling, or exudate.  EOMI, PERRLA. Nose: no drainage or turbinate edema/swelling.  No injection or focal lesion.  Mouth: lips without lesion/swelling.  Oral mucosa pink and moist.  Dentition intact and without obvious caries or gingival swelling.  Oropharynx without erythema, exudate, or swelling.  Neck - No masses or thyromegaly or limitation in range of motion CV: RRR, no m/r/g.   LUNGS: CTA bilat, nonlabored resps, good aeration in all lung fields. NEURO: no  tremor, no ataxia AFFECT: pleasant, lucid thought and speech.   IMPRESSION AND PLAN:  1) Asthma, mod persistent; well controlled as long as he is on dulera 200/5 2 puffs bid. RF'd this med + albuterol inhaler today.  Flu vaccine IM today.  2) ADHD: well controlled on current adderall regimen---30mg  XR in AM and 15mg  of "regular adderal" q afternoon. He does not take this daily.  I gave #30 of each of these meds today.  Pt to call when RF needed. Controlled substance contract reviewed and signed today and will be scanned into chart.  3) GERD: controlled on nexium 20-40 mg qd but he is unable to go off this med. RF'd 40mg  nexium qd.  Pt to do OTC strength if he finds this  financially feasible and still as helpful for his sx's.  FOLLOW UP: 25mo

## 2012-10-21 ENCOUNTER — Ambulatory Visit (INDEPENDENT_AMBULATORY_CARE_PROVIDER_SITE_OTHER): Payer: BC Managed Care – PPO | Admitting: Family Medicine

## 2012-10-21 ENCOUNTER — Encounter: Payer: Self-pay | Admitting: Family Medicine

## 2012-10-21 VITALS — BP 128/68 | HR 90 | Resp 18

## 2012-10-21 DIAGNOSIS — S0003XA Contusion of scalp, initial encounter: Secondary | ICD-10-CM

## 2012-10-21 DIAGNOSIS — S0100XA Unspecified open wound of scalp, initial encounter: Secondary | ICD-10-CM

## 2012-10-21 DIAGNOSIS — Z23 Encounter for immunization: Secondary | ICD-10-CM

## 2012-10-21 DIAGNOSIS — S0101XA Laceration without foreign body of scalp, initial encounter: Secondary | ICD-10-CM | POA: Insufficient documentation

## 2012-10-21 MED ORDER — OXYCODONE-ACETAMINOPHEN 5-325 MG PO TABS: 1.0000 | ORAL_TABLET | Freq: Four times a day (QID) | ORAL | Status: DC | PRN

## 2012-10-21 MED ORDER — CEPHALEXIN 500 MG PO CAPS: 500.0000 mg | ORAL_CAPSULE | Freq: Three times a day (TID) | ORAL | Status: DC

## 2012-10-21 NOTE — Progress Notes (Addendum)
OFFICE NOTE  10/21/2012  CC:  Chief Complaint  Patient presents with  . Laceration     HPI: Patient is a 22 y.o. Caucasian male who is here for scalp contusion/laceration sustained about 30 min ago while cutting limbs off of a tree. No LOC.  Hurts where lac is but denies HA otherwise.  No dizziness.  No nausea, no vision problems.  No memory problems.    Pertinent PMH:  Past Medical History  Diagnosis Date  . Asthma   . ADHD (attention deficit hyperactivity disorder)     Dx'd in gradeschool  . History of tobacco abuse     quit 2013  . Allergic rhinitis     dogs and cats (he has a dog and a cat).  . GERD (gastroesophageal reflux disease)     MEDS:  Outpatient Prescriptions Prior to Visit  Medication Sig Dispense Refill  . albuterol (VENTOLIN HFA) 108 (90 BASE) MCG/ACT inhaler Inhale 2 puffs into the lungs every 6 (six) hours as needed for wheezing.  18 g  1  . amphetamine-dextroamphetamine (ADDERALL XR) 30 MG 24 hr capsule Take 1 capsule (30 mg total) by mouth every morning.  30 capsule  0  . amphetamine-dextroamphetamine (ADDERALL) 15 MG tablet Take 1 tablet (15 mg total) by mouth daily. (to be taken in late afternoon in combo with his adderall XR which he takes in the morning)  30 tablet  0  . EPINEPHrine (EPIPEN) 0.3 mg/0.3 mL DEVI Inject 0.3 mLs (0.3 mg total) into the muscle once.  2 Device  3  . esomeprazole (NEXIUM) 40 MG capsule Take 1 capsule (40 mg total) by mouth daily before breakfast.  90 capsule  1  . mometasone-formoterol (DULERA) 200-5 MCG/ACT AERO 2 puffs twice per day EVERY DAY for asthma maintenance therapy  39 g  1   Facility-Administered Medications Prior to Visit  Medication Dose Route Frequency Provider Last Rate Last Dose  . diphenhydrAMINE (BENADRYL) capsule 50 mg  50 mg Oral Q6H PRN Jonita Albee, MD   50 mg at 03/15/12 1101    PE: Blood pressure 128/68, pulse 90, resp. rate 18, SpO2 97.00%. Gen: Alert, well appearing.  Patient is oriented to  person, place, time, and situation. AFFECT: pleasant, lucid thought and speech. PERRLA, EOMI Irregular-shaped scalp laceration with mild oozing of blood is noted just to the left of vertex of scalp, some generalized swelling of the scalp in the region around the laceration.  For the most part, wound edges were naturally well approximated without significant tension on them and without significant bleeding.  I irrigated the wound with sterile water and used wet gauze to expose the lac completely.   IMPRESSION AND PLAN:  Scalp contusion with laceration.   No sign of concussion but pt VERY tender directly beneath the wound.   I used super-glue to approximate the edges of some of the lac today but as stated above, most of the wound's edges were approximated well and no closure was needed.  After wound was cleaned and glue applied, there was no oozing of any blood. Instructions for wound care were discussed. Tdap given IM today. Skull x-ray ordered to r/o fracture. Percocet 5/325, 1-2 q6h prn, #30, no RF. Keflex 500mg  tid x 10d. Ibuprofen 600-800 mg tid with food.  Ice to the area recommended.  FOLLOW UP: 5-7d    ADDENDUM 10/21/12:  Pt returned in early afternoon today due to ongoing oozing of blood from wound. On inspection, the superglued portion  of the wound was still approximated and no bleeding noted here.  A couple of areas of oozing blood---very slow---were noted at small parts of the wound that I did not glue. I used a silver nitrate stick to cauterize a couple of areas and the bleeding stopped. Signs/symptoms to call or return for were reviewed and pt expressed understanding. --PM

## 2012-10-22 ENCOUNTER — Other Ambulatory Visit: Payer: Self-pay | Admitting: Family Medicine

## 2012-10-22 ENCOUNTER — Ambulatory Visit (HOSPITAL_BASED_OUTPATIENT_CLINIC_OR_DEPARTMENT_OTHER)
Admission: RE | Admit: 2012-10-22 | Discharge: 2012-10-22 | Disposition: A | Discharge status: Home / Self Care | Payer: 59 | Source: Ambulatory Visit | Attending: Family Medicine | Admitting: Family Medicine

## 2012-10-22 DIAGNOSIS — S0101XA Laceration without foreign body of scalp, initial encounter: Secondary | ICD-10-CM

## 2012-10-22 DIAGNOSIS — S0100XA Unspecified open wound of scalp, initial encounter: Secondary | ICD-10-CM | POA: Insufficient documentation

## 2012-10-22 DIAGNOSIS — W208XXA Other cause of strike by thrown, projected or falling object, initial encounter: Secondary | ICD-10-CM | POA: Insufficient documentation

## 2013-03-01 ENCOUNTER — Telehealth: Payer: Self-pay | Admitting: Family Medicine

## 2013-03-01 ENCOUNTER — Other Ambulatory Visit: Payer: Self-pay | Admitting: Family Medicine

## 2013-03-01 MED ORDER — AMPHETAMINE-DEXTROAMPHET ER 30 MG PO CP24: 30.0000 mg | ORAL_CAPSULE | ORAL | Status: DC

## 2013-03-01 MED ORDER — AMPHETAMINE-DEXTROAMPHETAMINE 15 MG PO TABS: 15.0000 mg | ORAL_TABLET | Freq: Every day | ORAL | Status: DC

## 2013-03-01 NOTE — Telephone Encounter (Signed)
Patient requesting refill of adderall.  Patient last seen 10/20/12 and then acutely on 10/21/12.  Patient last rx for adderall was 10/20/12.  Please advise refill.

## 2013-03-01 NOTE — Telephone Encounter (Signed)
Pls call patient when ready for pu.

## 2013-03-01 NOTE — Telephone Encounter (Signed)
Patient aware to pick up rx at front desk.

## 2013-03-01 NOTE — Telephone Encounter (Signed)
Rxs printed.  

## 2013-06-12 ENCOUNTER — Other Ambulatory Visit: Payer: Self-pay | Admitting: Family Medicine

## 2013-06-12 ENCOUNTER — Encounter: Payer: Self-pay | Admitting: Family Medicine

## 2013-06-12 DIAGNOSIS — S0101XA Laceration without foreign body of scalp, initial encounter: Secondary | ICD-10-CM

## 2013-06-13 ENCOUNTER — Other Ambulatory Visit: Payer: Self-pay | Admitting: Family Medicine

## 2013-06-13 MED ORDER — ALBUTEROL SULFATE HFA 108 (90 BASE) MCG/ACT IN AERS: 2.0000 | INHALATION_SPRAY | Freq: Four times a day (QID) | RESPIRATORY_TRACT | Status: DC | PRN

## 2013-06-21 ENCOUNTER — Other Ambulatory Visit: Payer: Self-pay

## 2013-06-21 MED ORDER — ALBUTEROL SULFATE HFA 108 (90 BASE) MCG/ACT IN AERS: 2.0000 | INHALATION_SPRAY | Freq: Four times a day (QID) | RESPIRATORY_TRACT | Status: DC | PRN

## 2013-08-26 ENCOUNTER — Encounter: Payer: Self-pay | Admitting: Family Medicine

## 2013-08-26 ENCOUNTER — Ambulatory Visit (INDEPENDENT_AMBULATORY_CARE_PROVIDER_SITE_OTHER): Payer: 59 | Admitting: Family Medicine

## 2013-08-26 VITALS — BP 135/87 | HR 86 | Temp 98.9°F | Resp 18 | Ht 72.0 in | Wt 233.0 lb

## 2013-08-26 DIAGNOSIS — F909 Attention-deficit hyperactivity disorder, unspecified type: Secondary | ICD-10-CM | POA: Diagnosis not present

## 2013-08-26 DIAGNOSIS — J45909 Unspecified asthma, uncomplicated: Secondary | ICD-10-CM | POA: Diagnosis not present

## 2013-08-26 DIAGNOSIS — J4540 Moderate persistent asthma, uncomplicated: Secondary | ICD-10-CM

## 2013-08-26 DIAGNOSIS — F902 Attention-deficit hyperactivity disorder, combined type: Secondary | ICD-10-CM

## 2013-08-26 DIAGNOSIS — K219 Gastro-esophageal reflux disease without esophagitis: Secondary | ICD-10-CM | POA: Diagnosis not present

## 2013-08-26 MED ORDER — AMPHETAMINE-DEXTROAMPHETAMINE 15 MG PO TABS: 15.0000 mg | ORAL_TABLET | Freq: Every day | ORAL | Status: DC

## 2013-08-26 MED ORDER — MOMETASONE FURO-FORMOTEROL FUM 200-5 MCG/ACT IN AERO: INHALATION_SPRAY | RESPIRATORY_TRACT | Status: AC

## 2013-08-26 MED ORDER — AMPHETAMINE-DEXTROAMPHET ER 30 MG PO CP24: 30.0000 mg | ORAL_CAPSULE | ORAL | Status: DC

## 2013-08-26 MED ORDER — ALBUTEROL SULFATE HFA 108 (90 BASE) MCG/ACT IN AERS: 2.0000 | INHALATION_SPRAY | Freq: Four times a day (QID) | RESPIRATORY_TRACT | Status: DC | PRN

## 2013-08-26 NOTE — Progress Notes (Signed)
Pre visit review using our clinic review tool, if applicable. No additional management support is needed unless otherwise documented below in the visit note. 

## 2013-08-26 NOTE — Progress Notes (Signed)
OFFICE NOTE  08/26/2013  CC:  Chief Complaint  Patient presents with  . Medication Refill     HPI: Patient is a 23 y.o. Caucasian male who is here for med recheck/ADD.  He has been out of adderall for 2-3 months. Unable to find time to f/u b/c working so much. Dulera 1 puff bid consistently, not requiring albuterol any now.  Still working in tree removal, etc, feeling well.  Takes otc strength nexium and this helps well with his GERD.  Pertinent PMH:  Past medical, surgical, social, and family history reviewed and no changes are noted since last office visit.  MEDS: Not taking oxycodone, nexium, or cephalexin listed below. Outpatient Prescriptions Prior to Visit  Medication Sig Dispense Refill  . albuterol (VENTOLIN HFA) 108 (90 BASE) MCG/ACT inhaler Inhale 2 puffs into the lungs every 6 (six) hours as needed for wheezing.  18 g  0  . EPINEPHrine (EPIPEN) 0.3 mg/0.3 mL DEVI Inject 0.3 mLs (0.3 mg total) into the muscle once.  2 Device  3  . esomeprazole (NEXIUM) 40 MG capsule Take 1 capsule (40 mg total) by mouth daily before breakfast.  90 capsule  1  . mometasone-formoterol (DULERA) 200-5 MCG/ACT AERO 2 puffs twice per day EVERY DAY for asthma maintenance therapy  39 g  1  . amphetamine-dextroamphetamine (ADDERALL XR) 30 MG 24 hr capsule Take 1 capsule (30 mg total) by mouth every morning.  30 capsule  0  . amphetamine-dextroamphetamine (ADDERALL) 15 MG tablet Take 1 tablet (15 mg total) by mouth daily. (to be taken in late afternoon in combo with his adderall XR which he takes in the morning)  30 tablet  0  . oxyCODONE-acetaminophen (ROXICET) 5-325 MG per tablet Take 1 tablet by mouth every 6 (six) hours as needed for pain.  30 tablet  0  . cephALEXin (KEFLEX) 500 MG capsule Take 1 capsule (500 mg total) by mouth 3 (three) times daily.  30 capsule  0   Facility-Administered Medications Prior to Visit  Medication Dose Route Frequency Provider Last Rate Last Dose  . diphenhydrAMINE  (BENADRYL) capsule 50 mg  50 mg Oral Q6H PRN Chris W Guest, MD   50 mg at 03/15/12 1101    PE: Blood pressure 135/87, pulse 86, temperature 98.9 F (37.2 C), temperature source Temporal, resp. rate 18, height 6' (1.829 m), weight 233 lb (105.688 kg), SpO2 97.00%. Wt Readings from Last 2 Encounters:  08/26/13 233 lb (105.688 kg)  10/20/12 224 lb (101.606 kg)   Gen: alert, oriented x 4, affect pleasant.  Lucid thinking and conversation noted. HEENT: PERRLA, EOMI.   Neck: no LAD, mass, or thyromegaly. CV: RRR, no m/r/g LUNGS: CTA bilat, nonlabored. NEURO: no tremor or tics noted on observation.  Coordination intact. CN 2-12 grossly intact bilaterally, strength 5/5 in all extremeties.  No ataxia.   IMPRESSION AND PLAN:  1) ADHD, doing well and needs to restart his adderall XR and adderall short acting rx's for this month, for Sept, and for Oct 2015--all rx's with approp fill on or after dating. A 6 mo f/u is fine with this patient.  2) Mod pers asthma, stable.  RF'd meds. Encouraged flu vaccine this fall.  3) GERD.  Well controlled on OTC strength nexium.  An After Visit Summary was printed and given to the patient.  FOLLOW UP: 76m[phone]<[phone]<[id number] [phone]<[phone]<[id number] Regional Medical CenteIn[facility]<[id number] [phone]<[phone]<[phone]<[phone]<[phone]<[phone]<[id number]>

## 2014-02-28 ENCOUNTER — Encounter: Payer: Self-pay | Admitting: Family Medicine

## 2014-02-28 ENCOUNTER — Ambulatory Visit (INDEPENDENT_AMBULATORY_CARE_PROVIDER_SITE_OTHER): Payer: 59 | Admitting: Family Medicine

## 2014-02-28 VITALS — BP 150/89 | HR 81 | Temp 98.9°F | Resp 18 | Ht 72.0 in | Wt 226.0 lb

## 2014-02-28 DIAGNOSIS — F909 Attention-deficit hyperactivity disorder, unspecified type: Secondary | ICD-10-CM

## 2014-02-28 DIAGNOSIS — J4540 Moderate persistent asthma, uncomplicated: Secondary | ICD-10-CM

## 2014-02-28 DIAGNOSIS — Z0000 Encounter for general adult medical examination without abnormal findings: Secondary | ICD-10-CM

## 2014-02-28 DIAGNOSIS — F900 Attention-deficit hyperactivity disorder, predominantly inattentive type: Secondary | ICD-10-CM

## 2014-02-28 MED ORDER — AMPHETAMINE-DEXTROAMPHET ER 30 MG PO CP24: 30.0000 mg | ORAL_CAPSULE | ORAL | Status: DC

## 2014-02-28 MED ORDER — AMPHETAMINE-DEXTROAMPHETAMINE 15 MG PO TABS: 15.0000 mg | ORAL_TABLET | Freq: Every day | ORAL | Status: AC

## 2014-02-28 MED ORDER — AMPHETAMINE-DEXTROAMPHET ER 30 MG PO CP24: 30.0000 mg | ORAL_CAPSULE | ORAL | Status: AC

## 2014-02-28 MED ORDER — AMPHETAMINE-DEXTROAMPHETAMINE 15 MG PO TABS: 15.0000 mg | ORAL_TABLET | Freq: Every day | ORAL | Status: DC

## 2014-02-28 NOTE — Progress Notes (Signed)
Pre visit review using our clinic review tool, if applicable. No additional management support is needed unless otherwise documented below in the visit note. 

## 2014-02-28 NOTE — Progress Notes (Signed)
Office Note 02/28/2014  CC:  Chief Complaint  Patient presents with  . Annual Exam   HPI:  Mark Gardner is a 24 y.o. White male who is here for CPE.  He will be coming off of his parents' insurance soon (when he turns 24). Still taking adderall XR  qAM and "plain" adderall  q afternoon for adult ADD.  He finds this regimen effective, no adverse side effects.  He does not take the med every day.  Says he takes dulera bid and does not have to take albuterol hardly ever.    He asks about a problem that started last night: says he was having intercourse with girlfriend, also had a sex toy he was using and he says his penis got injured "internally" due to a "pinch".  He says he peed and a little clot of blood came out and it hurt to urinate for a few seconds.  He has no trouble emptying his bladder.  He says he currently has no pain in penis, denies any swelling or deformity of penis.  He does not want me to examine his GU region today.   Past Medical History  Diagnosis Date  . Asthma   . ADHD (attention deficit hyperactivity disorder)     Dx'd in gradeschool  . History of tobacco abuse     quit 2013  . Allergic rhinitis     dogs and cats (he has a dog and a cat).  . GERD (gastroesophageal reflux disease)     Past Surgical History  Procedure Laterality Date  . Tonsillectomy and adenoidectomy  2008    Family History  Problem Relation Age of Onset  . Hyperlipidemia Mother   . Hyperlipidemia Father   . Heart disease Father     History   Social History  . Marital Status: Married    Spouse Name: N/A    Number of Children: N/A  . Years of Education: N/A   Occupational History  . Not on file.   Social History Main Topics  . Smoking status: Former Smoker    Types: Cigarettes  . Smokeless tobacco: Never Used     Comment: currently on Chantix 12/2011  . Alcohol Use: Yes  . Drug Use: No  . Sexual Activity: Yes    Birth Control/ Protection: None   Other  Topics Concern  . Not on file   Social History Narrative   Engaged, no children.   Some college in [place].   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   Tobacco+, currently quitting with chantix as of 12/2011--successfully quit 2013.   Social alcohol.  Works out several days a week.   Two brothers, one sister--all older.  Parents live in [place].    Outpatient Prescriptions Prior to Visit  Medication Sig Dispense Refill  . albuterol (VENTOLIN HFA) 108 (90 BASE) MCG/ACT inhaler Inhale 2 puffs into the lungs every 6 (six) hours as needed for wheezing. 18 g 1  . EPINEPHrine (EPIPEN) 0.3 mg/0.3 mL DEVI Inject 0.3 mLs (0.3 mg total) into the muscle once. 2 Device 3  . mometasone-formoterol (DULERA) 200-5 MCG/ACT AERO 2 puffs twice per day EVERY DAY for asthma maintenance therapy 39 g 11  . amphetamine-dextroamphetamine (ADDERALL XR) 30 MG 24 hr capsule Take 1 capsule (30 mg total) by mouth every morning. 30 capsule 0  . amphetamine-dextroamphetamine (ADDERALL) 15 MG tablet Take 1 tablet (15 mg total) by mouth daily. (to be taken in late afternoon in combo with his  adderall XR which he takes in the morning) 30 tablet 0   Facility-Administered Medications Prior to Visit  Medication Dose Route Frequency Provider Last Rate Last Dose  . diphenhydrAMINE (BENADRYL) capsule 50 mg  50 mg Oral Q6H PRN Jonita Albeehris W Guest, MD   50 mg at 03/15/12 1101    Allergies  Allergen Reactions  . Sulfa Antibiotics     ROS Review of Systems  Constitutional: Negative for fever, chills, appetite change and fatigue.  HENT: Negative for congestion, dental problem, ear pain and sore throat.   Eyes: Negative for discharge, redness and visual disturbance.  Respiratory: Negative for cough, chest tightness, shortness of breath and wheezing.   Cardiovascular: Negative for chest pain, palpitations and leg swelling.  Gastrointestinal: Negative for nausea, vomiting, abdominal pain, diarrhea and blood in stool.   Genitourinary: Positive for hematuria (see hpi). Negative for dysuria, urgency, frequency, flank pain, decreased urine volume, discharge, penile swelling, scrotal swelling, difficulty urinating, penile pain and testicular pain.  Musculoskeletal: Negative for myalgias, back pain, joint swelling, arthralgias and neck stiffness.  Skin: Negative for pallor and rash.  Neurological: Negative for dizziness, speech difficulty, weakness and headaches.  Hematological: Negative for adenopathy. Does not bruise/bleed easily.  Psychiatric/Behavioral: Negative for confusion and sleep disturbance. The patient is not nervous/anxious.     PE; Blood pressure 150/89, pulse 81, temperature 98.9 F (37.2 C), temperature source Temporal, resp. rate 18, height 6' (1.829 m), weight 226 lb (102.513 kg), SpO2 99 %.  BMI 30.6 Gen: Alert, well appearing.  Patient is oriented to person, place, time, and situation. AFFECT: pleasant, lucid thought and speech. ENT: Ears: EACs clear, normal epithelium.  TMs with good light reflex and landmarks bilaterally.  Eyes: no injection, icteris, swelling, or exudate.  EOMI, PERRLA. Nose: no drainage or turbinate edema/swelling.  No injection or focal lesion.  Mouth: lips without lesion/swelling.  Oral mucosa pink and moist.  Dentition intact and without obvious caries or gingival swelling.  Oropharynx without erythema, exudate, or swelling.  Neck: supple/nontender.  No LAD, mass, or TM.  Carotid pulses 2+ bilaterally, without bruits. CV: RRR, no m/r/g.   LUNGS: CTA bilat, nonlabored resps, good aeration in all lung fields. ABD: soft, NT, ND, BS normal.  No hepatospenomegaly or mass.  No bruits. EXT: no clubbing, cyanosis, or edema.  Musculoskeletal: no joint swelling, erythema, warmth, or tenderness.  ROM of all joints intact. Skin - no sores or suspicious lesions or rashes or color changes GU: pt declined.  Pertinent labs:  none  ASSESSMENT AND PLAN:   1) Adult ADHD; The  current medical regimen is effective;  continue present plan and medications. I printed rx's for adderall XR 30mg  qAM, #30 and 15mg  "plain" adderall q afternoon, #30 today for this month, Feb 2016, and March 2016.   Appropriate fill on/after date was noted on each rx (6 mo f/u ok with this pt).  2) Moderate persistent asthma, without complication: The current medical regimen is effective;  continue present plan and medications.  3) Penile urethral injury: per pt's description.  He did not want me to examine this today.  I reassured him that this should heal ok, but if he continues to have painful/bloody urination beyond 4-5 d OR if he starts having trouble emptying bladder then he would need to see urologist.  4) Health maintenance exam:  Reviewed age and gender appropriate health maintenance issues (prudent diet, regular exercise, health risks of tobacco and excessive alcohol, use of seatbelts, fire alarms in home,  use of sunscreen).  Also reviewed age and gender appropriate health screening as well as vaccine recommendations. Pt declined flu vaccine today.  An After Visit Summary was printed and given to the patient.  FOLLOW UP:  Return in about 6 months (around 08/29/2014) for routine chronic illness f/u.

## 2014-03-07 ENCOUNTER — Encounter: Payer: Self-pay | Admitting: Family Medicine

## 2014-04-21 ENCOUNTER — Other Ambulatory Visit: Payer: Self-pay | Admitting: Family Medicine

## 2014-08-22 IMAGING — CR DG SKULL COMPLETE 4+V
4 series · 4 of 4 positions shown · non-contrast
Comparison: None.

CLINICAL DATA: Hit on head with a tree limb.  Scalp laceration.

EXAM:
SKULL - COMPLETE 4 + VIEW

[w skull a.p./p.a. (1 of 2)]
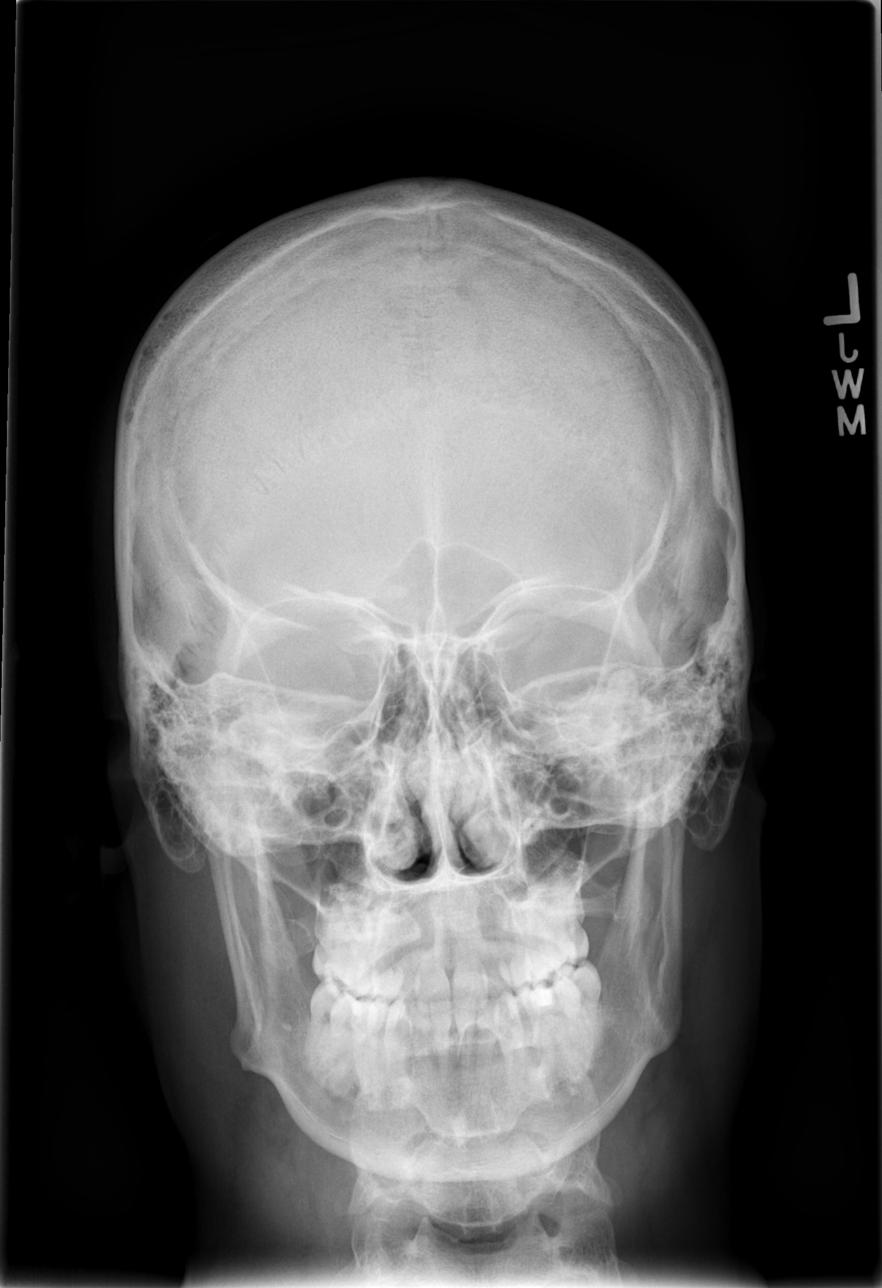

[w skull lat (1 of 2)]
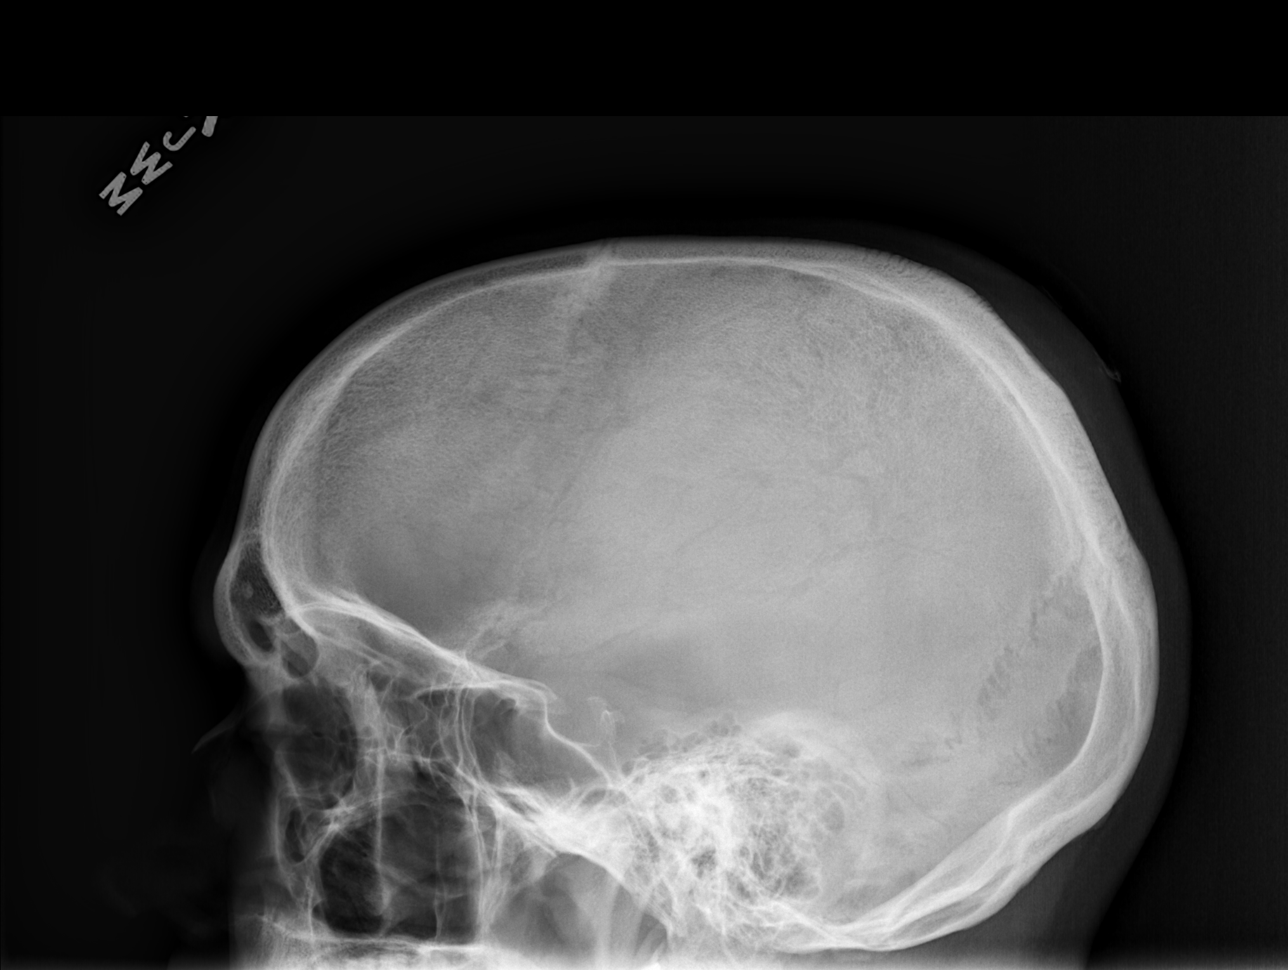

[w skull lat (2 of 2)]
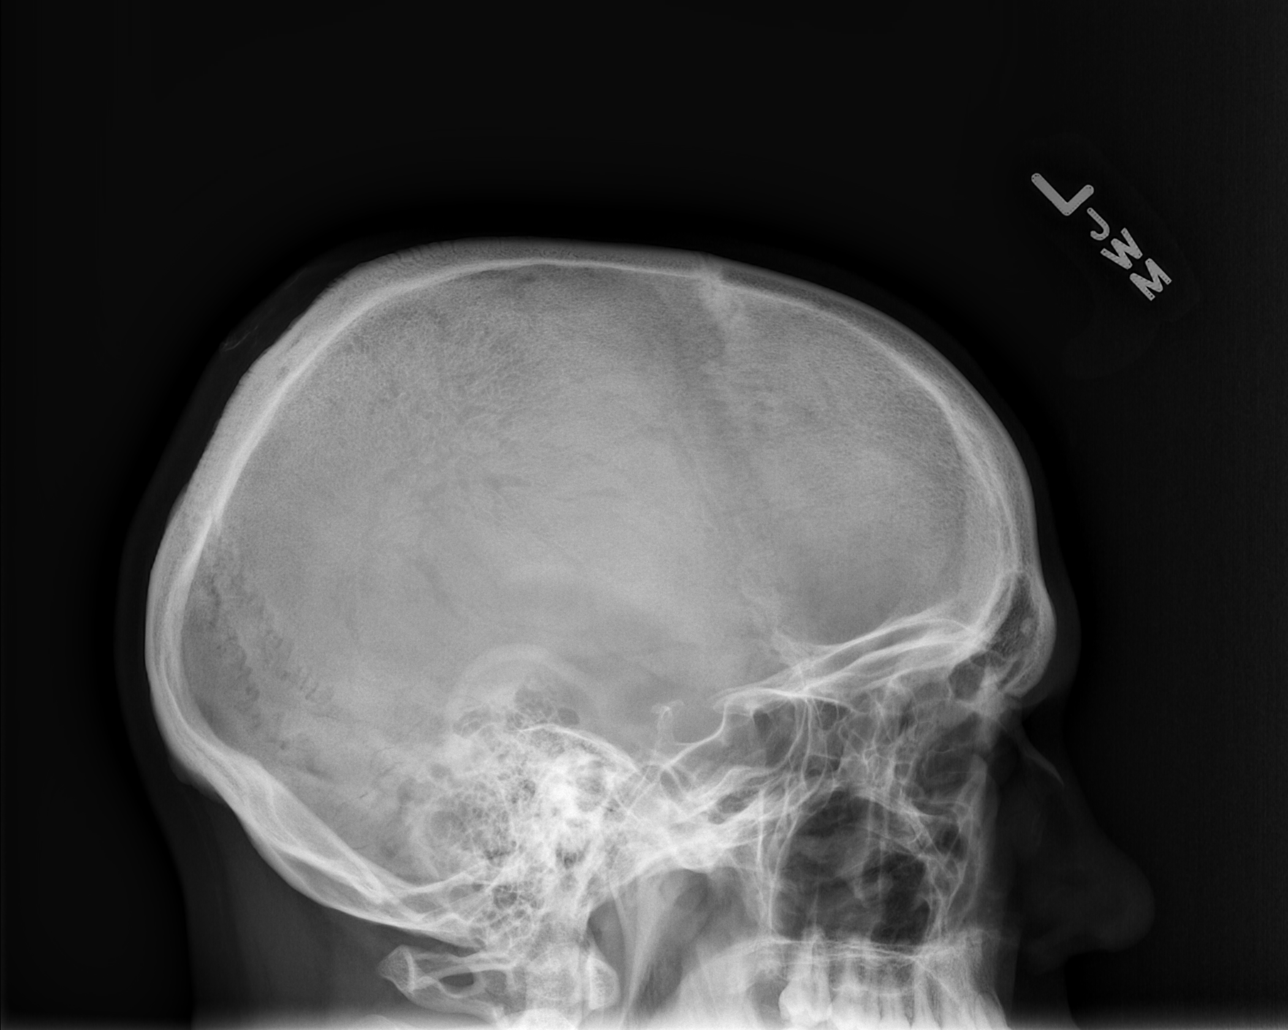

[w skull a.p./p.a. (2 of 2)]
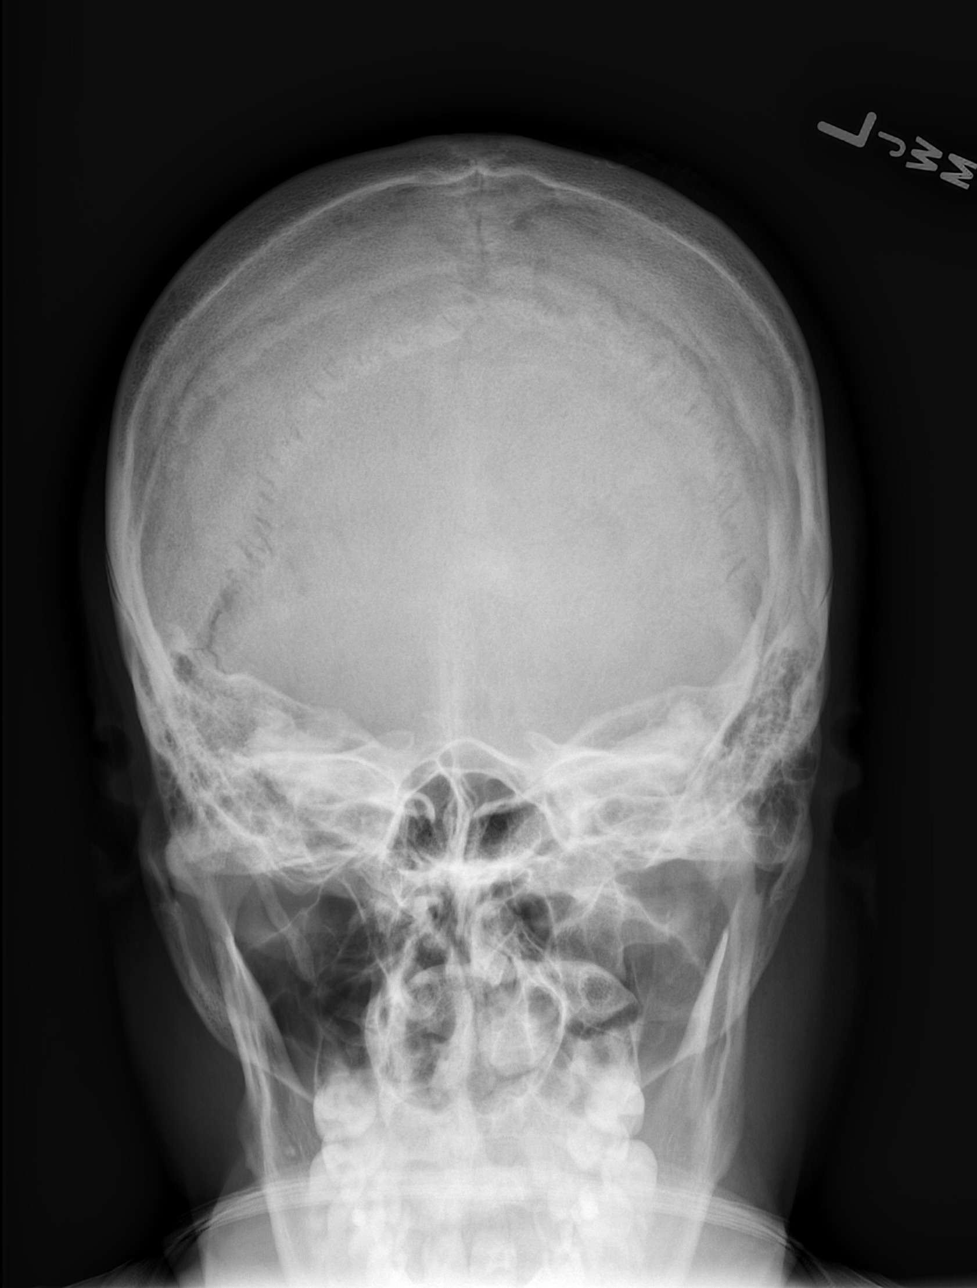

[4 of 4 positions shown; findings below may reference images not displayed]

FINDINGS: There is no evidence of skull fracture or other focal bone lesions.
No radiopaque foreign body identified.
IMPRESSION: Negative.
